# Patient Record
Sex: Male | Born: 1965 | Race: White | Hispanic: No | Marital: Married | Smoking: Never smoker
Health system: Southern US, Community
[De-identification: ages and names within clinical notes are randomized; demographics above are authoritative.]

## PROBLEM LIST (undated history)

## (undated) DIAGNOSIS — E7800 Pure hypercholesterolemia, unspecified: Secondary | ICD-10-CM

## (undated) HISTORY — PX: NO PAST SURGERIES: SHX2092

---

## 2003-01-05 ENCOUNTER — Encounter: Payer: Self-pay | Admitting: Emergency Medicine

## 2003-01-05 ENCOUNTER — Emergency Department (HOSPITAL_COMMUNITY): Admission: EM | Admit: 2003-01-05 | Discharge: 2003-01-05 | Payer: Self-pay | Admitting: Emergency Medicine

## 2003-01-07 ENCOUNTER — Ambulatory Visit (HOSPITAL_BASED_OUTPATIENT_CLINIC_OR_DEPARTMENT_OTHER): Admission: RE | Admit: 2003-01-07 | Discharge: 2003-01-07 | Payer: Self-pay | Admitting: *Deleted

## 2006-12-28 ENCOUNTER — Ambulatory Visit: Payer: Self-pay | Admitting: Internal Medicine

## 2015-09-03 ENCOUNTER — Encounter (INDEPENDENT_AMBULATORY_CARE_PROVIDER_SITE_OTHER): Payer: Self-pay | Admitting: *Deleted

## 2015-10-14 ENCOUNTER — Encounter (INDEPENDENT_AMBULATORY_CARE_PROVIDER_SITE_OTHER): Payer: Self-pay | Admitting: *Deleted

## 2015-10-14 ENCOUNTER — Other Ambulatory Visit (INDEPENDENT_AMBULATORY_CARE_PROVIDER_SITE_OTHER): Payer: Self-pay | Admitting: *Deleted

## 2015-10-14 DIAGNOSIS — Z1211 Encounter for screening for malignant neoplasm of colon: Secondary | ICD-10-CM

## 2015-10-28 ENCOUNTER — Ambulatory Visit: Payer: Self-pay

## 2015-10-28 ENCOUNTER — Other Ambulatory Visit: Payer: Self-pay | Admitting: Occupational Medicine

## 2015-10-28 DIAGNOSIS — Z0000 Encounter for general adult medical examination without abnormal findings: Secondary | ICD-10-CM

## 2015-10-30 ENCOUNTER — Encounter (INDEPENDENT_AMBULATORY_CARE_PROVIDER_SITE_OTHER): Payer: Self-pay | Admitting: *Deleted

## 2015-10-30 ENCOUNTER — Other Ambulatory Visit (INDEPENDENT_AMBULATORY_CARE_PROVIDER_SITE_OTHER): Payer: Self-pay | Admitting: *Deleted

## 2015-10-30 NOTE — Telephone Encounter (Signed)
Patient needs suprep 

## 2015-10-31 MED ORDER — SUPREP BOWEL PREP KIT 17.5-3.13-1.6 GM/177ML PO SOLN: 1.0000 | Freq: Once | ORAL | Status: DC

## 2015-11-18 ENCOUNTER — Telehealth (INDEPENDENT_AMBULATORY_CARE_PROVIDER_SITE_OTHER): Payer: Self-pay | Admitting: *Deleted

## 2015-11-18 NOTE — Telephone Encounter (Signed)
Referring MD/PCP: fusco   Procedure: tcs  Reason/Indication:  screening  Has patient had this procedure before?  no  If so, when, by whom and where?    Is there a family history of colon cancer?  no  Who?  What age when diagnosed?    Is patient diabetic?   no      Does patient have prosthetic heart valve or mechanical valve?  no  Do you have a pacemaker?  no  Has patient ever had endocarditis? no  Has patient had joint replacement within last 12 months?  no  Does patient tend to be constipated or take laxatives? no  Does patient have a history of alcohol/drug use?  no  Is patient on Coumadin, Plavix and/or Aspirin? yes  Medications: asa 81 mg daily, mega red daily, osteo biflex daily, atorvastatin 10 mg daily  Allergies: nkda  Medication Adjustment: asa 2 days  Procedure date & time: 12/11/15 at 1200

## 2015-11-19 NOTE — Telephone Encounter (Signed)
agree

## 2015-12-11 ENCOUNTER — Ambulatory Visit (HOSPITAL_COMMUNITY)
Admission: RE | Admit: 2015-12-11 | Discharge: 2015-12-11 | Disposition: A | Discharge status: Home / Self Care | Payer: 59 | Source: Ambulatory Visit | Attending: Internal Medicine | Admitting: Internal Medicine

## 2015-12-11 ENCOUNTER — Encounter (HOSPITAL_COMMUNITY)
Admission: RE | Disposition: A | Discharge status: Home / Self Care | Payer: Self-pay | Source: Ambulatory Visit | Attending: Internal Medicine

## 2015-12-11 ENCOUNTER — Encounter (HOSPITAL_COMMUNITY): Payer: Self-pay | Admitting: *Deleted

## 2015-12-11 DIAGNOSIS — K644 Residual hemorrhoidal skin tags: Secondary | ICD-10-CM

## 2015-12-11 DIAGNOSIS — D123 Benign neoplasm of transverse colon: Secondary | ICD-10-CM | POA: Insufficient documentation

## 2015-12-11 DIAGNOSIS — E7800 Pure hypercholesterolemia, unspecified: Secondary | ICD-10-CM | POA: Insufficient documentation

## 2015-12-11 DIAGNOSIS — Z1211 Encounter for screening for malignant neoplasm of colon: Secondary | ICD-10-CM | POA: Diagnosis present

## 2015-12-11 DIAGNOSIS — Z7982 Long term (current) use of aspirin: Secondary | ICD-10-CM | POA: Insufficient documentation

## 2015-12-11 DIAGNOSIS — Z79899 Other long term (current) drug therapy: Secondary | ICD-10-CM | POA: Diagnosis not present

## 2015-12-11 HISTORY — DX: Pure hypercholesterolemia, unspecified: E78.00

## 2015-12-11 HISTORY — PX: POLYPECTOMY: SHX5525

## 2015-12-11 HISTORY — PX: COLONOSCOPY: SHX5424

## 2015-12-11 SURGERY — COLONOSCOPY
Anesthesia: Moderate Sedation

## 2015-12-11 MED ORDER — SODIUM CHLORIDE 0.9 % IV SOLN
INTRAVENOUS | Status: DC
  Administered 2015-12-11: 11:00:00 via INTRAVENOUS

## 2015-12-11 MED ORDER — MIDAZOLAM HCL 5 MG/5ML IJ SOLN
INTRAMUSCULAR | Status: DC | PRN
  Administered 2015-12-11 (×3): 2 mg via INTRAVENOUS

## 2015-12-11 MED ORDER — MEPERIDINE HCL 50 MG/ML IJ SOLN
INTRAMUSCULAR | Status: DC | PRN
  Administered 2015-12-11 (×2): 25 mg via INTRAVENOUS

## 2015-12-11 MED ORDER — MIDAZOLAM HCL 5 MG/5ML IJ SOLN
INTRAMUSCULAR | Status: AC
  Filled 2015-12-11: qty 10

## 2015-12-11 MED ORDER — MEPERIDINE HCL 50 MG/ML IJ SOLN
INTRAMUSCULAR | Status: AC
  Filled 2015-12-11: qty 1

## 2015-12-11 NOTE — H&P (Signed)
Nathan Simmons is an 50 y.o. male.   Chief Complaint: Patient is here for colonoscopy. HPI: 50 year old Caucasian male who is in for screening colonoscopy. He denies abdominal pain change in bowel habits or rectal bleeding. Family history is negative for CRC.  Past Medical History  Diagnosis Date  . Hypercholesteremia     Past Surgical History  Procedure Laterality Date  . No past surgeries      History reviewed. No pertinent family history. Social History:  reports that he has never smoked. He does not have any smokeless tobacco history on file. He reports that he drinks alcohol. He reports that he does not use illicit drugs.  Allergies: No Known Allergies  Medications Prior to Admission  Medication Sig Dispense Refill  . aspirin EC 81 MG tablet Take 81 mg by mouth daily.    Marland Kitchen atorvastatin (LIPITOR) 10 MG tablet Take 1 tablet by mouth daily.  3  . Bioflavonoid Products (BIOFLEX PO) Take 1 tablet by mouth daily.    Marland Kitchen KRILL OIL PO Take 1 capsule by mouth daily.    . Multiple Vitamin (MULTIVITAMIN WITH MINERALS) TABS tablet Take 1 tablet by mouth daily.    Manus Gunning BOWEL PREP SOLN Take 1 kit by mouth once. 1 Bottle 0    No results found for this or any previous visit (from the past 48 hour(s)). No results found.  ROS  Blood pressure 131/83, temperature 97.8 F (36.6 C), temperature source Oral, resp. rate 18, height 5' 8"  (1.727 m), weight 183 lb (83.008 kg), SpO2 99 %. Physical Exam  Constitutional: He appears well-developed and well-nourished.  HENT:  Mouth/Throat: Oropharynx is clear and moist.  Eyes: Conjunctivae are normal. No scleral icterus.  Neck: No thyromegaly present.  Cardiovascular: Normal rate, regular rhythm and normal heart sounds.   No murmur heard. Respiratory: Effort normal and breath sounds normal.  GI: Soft. He exhibits no distension and no mass. There is no tenderness.  Musculoskeletal: He exhibits no edema.  Lymphadenopathy:    He has no cervical  adenopathy.  Neurological: He is alert.  Skin: Skin is warm and dry.     Assessment/Plan Average risk screening colonoscopy.  Rogene Houston, MD 12/11/2015, 11:34 AM

## 2015-12-11 NOTE — Op Note (Signed)
[facility] Patient Name: Nathan Simmons Procedure Date: 12/11/2015 11:11 AM MRN: [id number] Date of Birth: 13-Jun-1966 Attending MD: Hildred Laser , MD CSN: [id number] Age: 50 Admit Type: Outpatient Procedure:                Colonoscopy Indications:              Screening for colorectal malignant neoplasm Providers:                Hildred Laser, MD, Renda Rolls, RN, Isabella Stalling,                            Technician Referring MD:             Glo Herring, MD Medicines:                Meperidine 50 mg IV, Midazolam 6 mg IV Complications:            No immediate complications. Estimated Blood Loss:     Estimated blood loss was minimal. Procedure:                Pre-Anesthesia Assessment:                           - Prior to the procedure, a History and Physical                            was performed, and patient medications and                            allergies were reviewed. The patient's tolerance of                            previous anesthesia was also reviewed. The risks                            and benefits of the procedure and the sedation                            options and risks were discussed with the patient.                            All questions were answered, and informed consent                            was obtained. Prior Anticoagulants: The patient has                            taken no previous anticoagulant or antiplatelet                            agents. ASA Grade Assessment: I - A normal, healthy                            patient. After reviewing the risks and benefits,  the patient was deemed in satisfactory condition to                            undergo the procedure.                           After obtaining informed consent, the colonoscope                            was passed under direct vision. Throughout the                            procedure, the patient's blood pressure, pulse, and              oxygen saturations were monitored continuously. The                            EC-[id number]) scope was introduced through                            the anus and advanced to the the cecum, identified                            by appendiceal orifice and ileocecal valve. The                            colonoscopy was performed without difficulty. The                            patient tolerated the procedure well. The quality                            of the bowel preparation was adequate. The                            ileocecal valve, appendiceal orifice, and rectum                            were photographed. Scope In: 11:43:44 AM Scope Out: 12:00:01 PM Scope Withdrawal Time: 0 hours 10 minutes 34 seconds  Total Procedure Duration: 0 hours 16 minutes 17 seconds  Findings:      A 5 mm polyp was found in the hepatic flexure. The polyp was sessile.       The polyp was removed with a cold snare. Resection and retrieval were       complete.      External hemorrhoids were found during retroflexion. The hemorrhoids       were small. Impression:               - One 5 mm polyp at the hepatic flexure, removed                            with a cold snare. Resected and retrieved.                           -  External hemorrhoids. Moderate Sedation:      Moderate (conscious) sedation was administered by the endoscopy nurse       and supervised by the endoscopist. The following parameters were       monitored: oxygen saturation, heart rate, blood pressure, CO2       capnography and response to care. Total physician intraservice time was       24 minutes. Recommendation:           - Patient has a contact number available for                            emergencies. The signs and symptoms of potential                            delayed complications were discussed with the                            patient. Return to normal activities tomorrow.                            Written  discharge instructions were provided to the                            patient.                           - Resume previous diet today.                           - Continue present medications.                           - Repeat colonoscopy date to be determined after                            pending pathology results are reviewed for                            surveillance based on pathology results. Procedure Code(s):        --- Professional ---                           [phone], Colonoscopy, flexible; with removal of                            tumor(s), polyp(s), or other lesion(s) by snare                            technique                           99152, Moderate sedation services provided by the                            same physician or other qualified health care  professional performing the diagnostic or                            therapeutic service that the sedation supports,                            requiring the presence of an independent trained                            observer to assist in the monitoring of the                            patient's level of consciousness and physiological                            status; initial 15 minutes of intraservice time,                            patient age 5 years or older                           [phone], Moderate sedation services; each additional                            15 minutes intraservice time Diagnosis Code(s):        --- Professional ---                           Z12.11, Encounter for screening for malignant                            neoplasm of colon                           D12.3, Benign neoplasm of transverse colon (hepatic                            flexure or splenic flexure)                           K64.4, Residual hemorrhoidal skin tags CPT copyright 2016 American Medical Association. All rights reserved. The codes documented in this report are preliminary and upon coder  review may  be revised to meet current compliance requirements. Hildred Laser, MD Hildred Laser, MD 12/11/2015 12:08:38 PM This report has been signed electronically. Number of Addenda: 0

## 2015-12-11 NOTE — Discharge Instructions (Signed)
Resume usual medications and diet. No driving for 24 hours. Physician will call with biopsy results.  Colon Polyps Polyps are lumps of extra tissue growing inside the body. Polyps can grow in the large intestine (colon). Most colon polyps are noncancerous (benign). However, some colon polyps can become cancerous over time. Polyps that are larger than a pea may be harmful. To be safe, caregivers remove and test all polyps. CAUSES  Polyps form when mutations in the genes cause your cells to grow and divide even though no more tissue is needed. RISK FACTORS There are a number of risk factors that can increase your chances of getting colon polyps. They include:  Being older than 50 years.  Family history of colon polyps or colon cancer.  Long-term colon diseases, such as colitis or Crohn disease.  Being overweight.  Smoking.  Being inactive.  Drinking too much alcohol. SYMPTOMS  Most small polyps do not cause symptoms. If symptoms are present, they may include:  Blood in the stool. The stool may look dark red or black.  Constipation or diarrhea that lasts longer than 1 week. DIAGNOSIS People often do not know they have polyps until their caregiver finds them during a regular checkup. Your caregiver can use 4 tests to check for polyps:  Digital rectal exam. The caregiver wears gloves and feels inside the rectum. This test would find polyps only in the rectum.  Barium enema. The caregiver puts a liquid called barium into your rectum before taking X-rays of your colon. Barium makes your colon look white. Polyps are dark, so they are easy to see in the X-ray pictures.  Sigmoidoscopy. A thin, flexible tube (sigmoidoscope) is placed into your rectum. The sigmoidoscope has a light and tiny camera in it. The caregiver uses the sigmoidoscope to look at the last third of your colon.  Colonoscopy. This test is like sigmoidoscopy, but the caregiver looks at the entire colon. This is the most  common method for finding and removing polyps. TREATMENT  Any polyps will be removed during a sigmoidoscopy or colonoscopy. The polyps are then tested for cancer. PREVENTION  To help lower your risk of getting more colon polyps:  Eat plenty of fruits and vegetables. Avoid eating fatty foods.  Do not smoke.  Avoid drinking alcohol.  Exercise every day.  Lose weight if recommended by your caregiver.  Eat plenty of calcium and folate. Foods that are rich in calcium include milk, cheese, and broccoli. Foods that are rich in folate include chickpeas, kidney beans, and spinach. HOME CARE INSTRUCTIONS Keep all follow-up appointments as directed by your caregiver. You may need periodic exams to check for polyps. SEEK MEDICAL CARE IF: You notice bleeding during a bowel movement.   This information is not intended to replace advice given to you by your health care provider. Make sure you discuss any questions you have with your health care provider.   Document Released: 06/02/2004 Document Revised: 09/27/2014 Document Reviewed: 11/16/2011 Elsevier Interactive Patient Education 2016 Elsevier [employer]. Colonoscopy, Care After Refer to this sheet in the next few weeks. These instructions provide you with information on caring for yourself after your procedure. Your health care provider may also give you more specific instructions. Your treatment has been planned according to current medical practices, but problems sometimes occur. Call your health care provider if you have any problems or questions after your procedure. WHAT TO EXPECT AFTER THE PROCEDURE  After your procedure, it is typical to have the following:  A small  amount of blood in your stool.  Moderate amounts of gas and mild abdominal cramping or bloating. HOME CARE INSTRUCTIONS  Do not drive, operate machinery, or sign important documents for 24 hours.  You may shower and resume your regular physical activities, but move at a slower  pace for the first 24 hours.  Take frequent rest periods for the first 24 hours.  Walk around or put a warm pack on your abdomen to help reduce abdominal cramping and bloating.  Drink enough fluids to keep your urine clear or pale yellow.  You may resume your normal diet as instructed by your health care provider. Avoid heavy or fried foods that are hard to digest.  Avoid drinking alcohol for 24 hours or as instructed by your health care provider.  Only take over-the-counter or prescription medicines as directed by your health care provider.  If a tissue sample (biopsy) was taken during your procedure:  Do not take aspirin or blood thinners for 7 days, or as instructed by your health care provider.  Do not drink alcohol for 7 days, or as instructed by your health care provider.  Eat soft foods for the first 24 hours. SEEK MEDICAL CARE IF: You have persistent spotting of blood in your stool 2-3 days after the procedure. SEEK IMMEDIATE MEDICAL CARE IF:  You have more than a small spotting of blood in your stool.  You pass large blood clots in your stool.  Your abdomen is swollen (distended).  You have nausea or vomiting.  You have a fever.  You have increasing abdominal pain that is not relieved with medicine.   This information is not intended to replace advice given to you by your health care provider. Make sure you discuss any questions you have with your health care provider.   Document Released: 04/20/2004 Document Revised: 06/27/2013 Document Reviewed: 05/14/2013 Elsevier Interactive Patient Education [employer].

## 2015-12-15 ENCOUNTER — Encounter (HOSPITAL_COMMUNITY): Payer: Self-pay | Admitting: Internal Medicine

## 2016-12-24 IMAGING — CR DG CHEST 1V
1 series · 1 of 1 positions shown · non-contrast
Comparison: None.

CLINICAL DATA: Annual exam.  Nonsmoker.

EXAM:
CHEST 1 VIEW

[view not recorded]
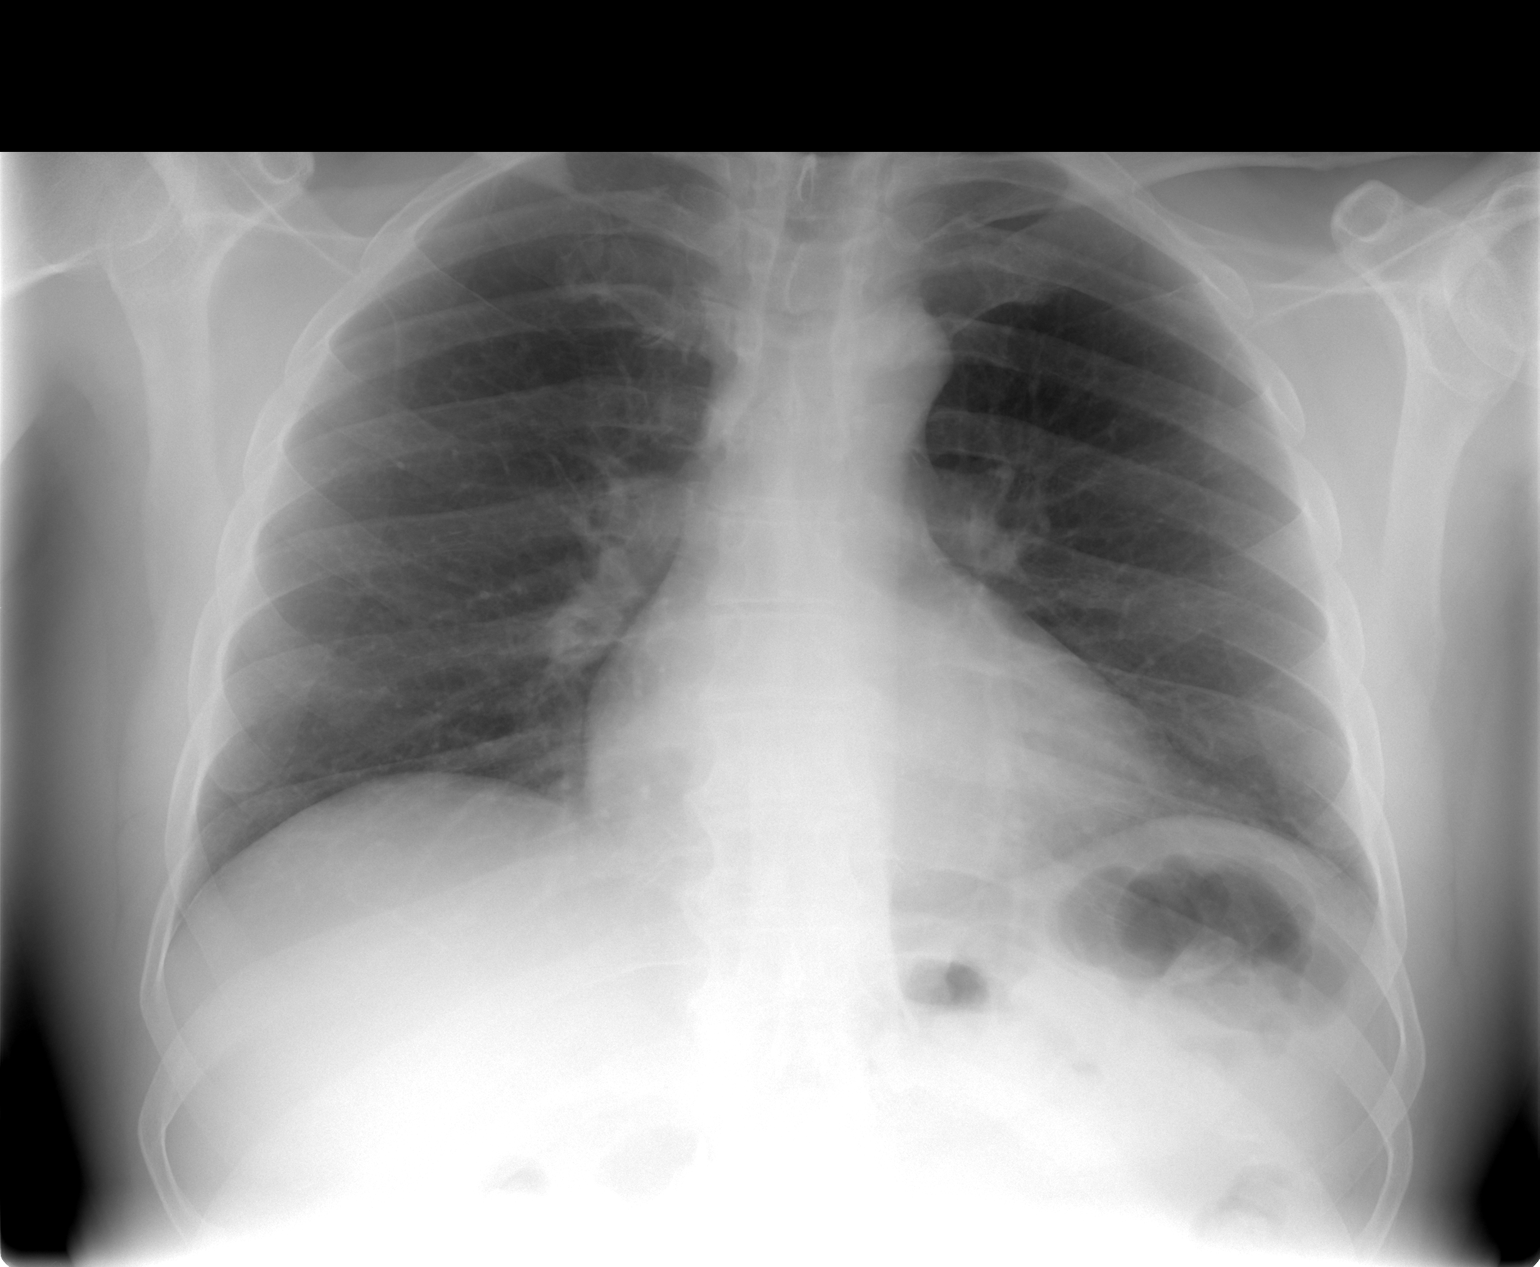

[1 of 1 positions shown; findings below may reference images not displayed]

FINDINGS: The heart size and mediastinal contours are within normal limits.
Both lungs are clear. The visualized skeletal structures are
unremarkable.
IMPRESSION: No active disease.

## 2017-04-18 DIAGNOSIS — H401121 Primary open-angle glaucoma, left eye, mild stage: Secondary | ICD-10-CM | POA: Diagnosis not present

## 2017-04-29 DIAGNOSIS — E748 Other specified disorders of carbohydrate metabolism: Secondary | ICD-10-CM | POA: Diagnosis not present

## 2017-04-29 DIAGNOSIS — Z1389 Encounter for screening for other disorder: Secondary | ICD-10-CM | POA: Diagnosis not present

## 2017-04-29 DIAGNOSIS — Z0000 Encounter for general adult medical examination without abnormal findings: Secondary | ICD-10-CM | POA: Diagnosis not present

## 2017-10-03 DIAGNOSIS — H40051 Ocular hypertension, right eye: Secondary | ICD-10-CM | POA: Diagnosis not present

## 2017-10-03 DIAGNOSIS — H401121 Primary open-angle glaucoma, left eye, mild stage: Secondary | ICD-10-CM | POA: Diagnosis not present

## 2017-10-13 DIAGNOSIS — Z6830 Body mass index (BMI) 30.0-30.9, adult: Secondary | ICD-10-CM | POA: Diagnosis not present

## 2017-10-13 DIAGNOSIS — J329 Chronic sinusitis, unspecified: Secondary | ICD-10-CM | POA: Diagnosis not present

## 2017-10-13 DIAGNOSIS — E669 Obesity, unspecified: Secondary | ICD-10-CM | POA: Diagnosis not present

## 2018-09-11 DIAGNOSIS — Z0000 Encounter for general adult medical examination without abnormal findings: Secondary | ICD-10-CM | POA: Diagnosis not present

## 2018-09-11 DIAGNOSIS — E663 Overweight: Secondary | ICD-10-CM | POA: Diagnosis not present

## 2018-09-11 DIAGNOSIS — E782 Mixed hyperlipidemia: Secondary | ICD-10-CM | POA: Diagnosis not present

## 2018-09-11 DIAGNOSIS — Z6829 Body mass index (BMI) 29.0-29.9, adult: Secondary | ICD-10-CM | POA: Diagnosis not present

## 2018-09-11 DIAGNOSIS — R001 Bradycardia, unspecified: Secondary | ICD-10-CM | POA: Diagnosis not present

## 2018-09-11 DIAGNOSIS — Z1389 Encounter for screening for other disorder: Secondary | ICD-10-CM | POA: Diagnosis not present

## 2021-01-01 ENCOUNTER — Emergency Department (HOSPITAL_COMMUNITY)
Admission: EM | Admit: 2021-01-01 | Discharge: 2021-01-01 | Disposition: A | Discharge status: Home / Self Care | Payer: 59 | Attending: Emergency Medicine | Admitting: Emergency Medicine

## 2021-01-01 ENCOUNTER — Other Ambulatory Visit: Payer: Self-pay

## 2021-01-01 ENCOUNTER — Encounter (HOSPITAL_COMMUNITY): Payer: Self-pay | Admitting: Emergency Medicine

## 2021-01-01 ENCOUNTER — Emergency Department (HOSPITAL_COMMUNITY): Payer: 59

## 2021-01-01 DIAGNOSIS — Z7982 Long term (current) use of aspirin: Secondary | ICD-10-CM | POA: Insufficient documentation

## 2021-01-01 DIAGNOSIS — L02519 Cutaneous abscess of unspecified hand: Secondary | ICD-10-CM

## 2021-01-01 DIAGNOSIS — L02512 Cutaneous abscess of left hand: Secondary | ICD-10-CM | POA: Insufficient documentation

## 2021-01-01 LAB — COMPREHENSIVE METABOLIC PANEL
ALT: 26 U/L (ref 0–44)
AST: 27 U/L (ref 15–41)
Albumin: 3.9 g/dL (ref 3.5–5.0)
Alkaline Phosphatase: 66 U/L (ref 38–126)
Anion gap: 10 (ref 5–15)
BUN: 14 mg/dL (ref 6–20)
CO2: 22 mmol/L (ref 22–32)
Calcium: 9.1 mg/dL (ref 8.9–10.3)
Chloride: 104 mmol/L (ref 98–111)
Creatinine, Ser: 1.1 mg/dL (ref 0.61–1.24)
GFR, Estimated: >60 mL/min (ref >60)
Glucose, Bld: 107 mg/dL — ABNORMAL HIGH (ref 70–99)
Potassium: 3.7 mmol/L (ref 3.5–5.1)
Sodium: 136 mmol/L (ref 135–145)
Total Bilirubin: 0.5 mg/dL (ref 0.3–1.2)
Total Protein: 7.1 g/dL (ref 6.5–8.1)

## 2021-01-01 LAB — CBC WITH DIFFERENTIAL/PLATELET
Abs Immature Granulocytes: 0.03 10*3/uL (ref 0.00–0.07)
Basophils Absolute: 0.1 10*3/uL (ref 0.0–0.1)
Basophils Relative: 1 %
Eosinophils Absolute: 0.1 10*3/uL (ref 0.0–0.5)
Eosinophils Relative: 1 %
HCT: 41.1 % (ref 39.0–52.0)
Hemoglobin: 13.6 g/dL (ref 13.0–17.0)
Immature Granulocytes: 0 %
Lymphocytes Relative: 18 %
Lymphs Abs: 2 10*3/uL (ref 0.7–4.0)
MCH: 28.6 pg (ref 26.0–34.0)
MCHC: 33.1 g/dL (ref 30.0–36.0)
MCV: 86.3 fL (ref 80.0–100.0)
Monocytes Absolute: 1.2 10*3/uL — ABNORMAL HIGH (ref 0.1–1.0)
Monocytes Relative: 11 %
Neutro Abs: 7.9 10*3/uL — ABNORMAL HIGH (ref 1.7–7.7)
Neutrophils Relative %: 69 %
Platelets: 310 10*3/uL (ref 150–400)
RBC: 4.76 MIL/uL (ref 4.22–5.81)
RDW: 14.7 % (ref 11.5–15.5)
WBC: 11.3 10*3/uL — ABNORMAL HIGH (ref 4.0–10.5)
nRBC: 0 % (ref 0.0–0.2)

## 2021-01-01 MED ORDER — DOXYCYCLINE HYCLATE 100 MG PO CAPS: 100.0000 mg | ORAL_CAPSULE | Freq: Two times a day (BID) | ORAL | 0 refills | Status: DC

## 2021-01-01 MED ORDER — LIDOCAINE HCL (PF) 1 % IJ SOLN
5.0000 mL | Freq: Once | INTRAMUSCULAR | Status: AC
  Administered 2021-01-01: 5 mL
  Filled 2021-01-01: qty 5

## 2021-01-01 NOTE — Discharge Instructions (Addendum)
Continue the antibiotic for a full 10-day course. Follow up with Dr. Lenon Curt to insure the infection is improving and not worsening.   Return to the ED with any worsening symptoms or new concerns.

## 2021-01-01 NOTE — ED Provider Notes (Addendum)
MSE was initiated and I personally evaluated the patient and placed orders (if any) at  4:55 AM on January 01, 2021.  Doing yard work 6 days ago and felt like he got a blister on his left hand at the base of the middle finger. It continued to swelling, extending to the dorsal hand. On Doxycycline x 2 days without improvement. No fever.   Today's Vitals   01/01/21 0455 01/01/21 0457  BP:  (!) 142/87  Pulse:  70  Resp:  16  Temp:  98.5 F (36.9 C)  TempSrc:  Oral  SpO2:  99%  Weight: 91 kg   Height: 5\' 8"  (1.727 m)   PainSc: 0-No pain    Body mass index is 30.5 kg/m.  Draining blister to palmar left hand at base of ring finger. FROM all joints. Swelling over dorsal hand with erythema.   The patient appears stable so that the remainder of the MSE may be completed by another provider.   Charlann Lange, PA-C 01/01/21 0500    Charlann Lange, PA-C 01/01/21 4081    Ezequiel Essex, MD 01/01/21 [phone]

## 2021-01-01 NOTE — ED Notes (Signed)
Provider at bedside at this time

## 2021-01-01 NOTE — ED Notes (Signed)
Provider at bedside

## 2021-01-01 NOTE — ED Triage Notes (Signed)
Patient reports left hand skin abscess with drainage onset last Friday , prescribed by his PCP with Doxycycline with no improvement .

## 2021-01-01 NOTE — ED Notes (Signed)
Radiology at bedside at this time.

## 2021-01-01 NOTE — ED Provider Notes (Signed)
[facility] EMERGENCY DEPARTMENT Provider Note   CSN: [id number] Arrival date & time: 01/01/21  0450     History Chief Complaint  Patient presents with  . Abscess    Nathan Simmons is a 55 y.o. male.  Doing yard work 6 days ago and felt like he got a blister on his left hand at the base of the ring finger. He thought initially he might been stuck by something or had a foreign body. It continued to swell, extending to the dorsal hand. He went to his PCP and has been on Doxycycline x 2 days without improvement. He does not feel it is worse. No fever. He reports that he used a needle and has been able to drain purulent material from the wound.   The history is provided by the patient. No language interpreter was used.  Abscess Associated symptoms: no fever and no nausea        Past Medical History:  Diagnosis Date  . Hypercholesteremia     There are no problems to display for this patient.   Past Surgical History:  Procedure Laterality Date  . COLONOSCOPY N/A 12/11/2015   Procedure: COLONOSCOPY;  Surgeon: Rogene Houston, MD;  Location: [facility] SU[facility];  Service: Endoscopy;  Laterality: N/A;  1130 - moved to 3/23 @ 12:00  . NO PAST SURGERIES    . POLYPECTOMY  12/11/2015   Procedure: POLYPECTOMY;  Surgeon: Rogene Houston, MD;  Location: [facility] ENDO SUITE;  Service: Endoscopy;;  Hepatic flexure polyp removed via cold snare       No family history on file.  Social History   Tobacco Use  . Smoking status: Never Smoker  . Smokeless tobacco: Never Used  Substance Use Topics  . Alcohol use: Yes    Comment: Once montyly  . Drug use: No    Home Medications Prior to Admission medications   Medication Sig Start Date End Date Taking? Authorizing Provider  aspirin EC 81 MG tablet Take 81 mg by mouth daily.    [provider]  atorvastatin (LIPITOR) 10 MG tablet Take 1 tablet by mouth daily. 08/29/15   [provider]  Bioflavonoid Products  (BIOFLEX PO) Take 1 tablet by mouth daily.    [provider]  KRILL OIL PO Take 1 capsule by mouth daily.    [provider]  Multiple Vitamin (MULTIVITAMIN WITH MINERALS) TABS tablet Take 1 tablet by mouth daily.    [provider]    Allergies    Patient has no known allergies.  Review of Systems   Review of Systems  Constitutional: Negative for fever.  Gastrointestinal: Negative for nausea.  Musculoskeletal:       See HPI.  Skin: Positive for color change and wound.  Neurological: Negative for numbness.    Physical Exam Updated Vital Signs BP (!) 142/87 (BP Location: Right Arm)   Pulse 70   Temp 98.5 F (36.9 C) (Oral)   Resp 16   Ht 5\' 8"  (1.727 m)   Wt 91 kg   SpO2 99%   BMI 30.50 kg/m   Physical Exam Constitutional:      Appearance: He is well-developed.  Pulmonary:     Effort: Pulmonary effort is normal.  Musculoskeletal:        General: Normal range of motion.     Cervical back: Normal range of motion.     Comments: Wound to left palmar hand over 4th MCP. No pain with movement of  the joint. No flexor tendon tenderness. 4th finger is swollen but soft and not significantly tender.  Skin:    General: Skin is warm and dry.     Comments: Blister to palmar base 4th left digit with swelling over ulnar dorsum of hand to wrist with erythema.   Neurological:     Mental Status: He is alert and oriented to person, place, and time.       ED Results / Procedures / Treatments   Labs (all labs ordered are listed, but only abnormal results are displayed) Labs Reviewed  CBC WITH DIFFERENTIAL/PLATELET  COMPREHENSIVE METABOLIC PANEL    EKG None  Radiology No results found.  Procedures .Marland KitchenIncision and Drainage  Date/Time: 01/01/2021 6:17 AM Performed by: Charlann Lange, PA-C Authorized by: Charlann Lange, PA-C   Consent:    Consent obtained:  Verbal   Consent given by:  Patient Universal protocol:    Procedure explained and  questions answered to patient or proxy's satisfaction: yes     Immediately prior to procedure, a time out was called: yes     Patient identity confirmed:  Verbally with patient Location:    Type:  Abscess   Location:  Upper extremity   Upper extremity location:  Hand   Hand location:  L hand Pre-procedure details:    Skin preparation:  Povidone-iodine Sedation:    Sedation type:  None Anesthesia:    Anesthesia method:  Local infiltration   Local anesthetic:  Lidocaine 1% w/o epi Procedure type:    Complexity:  Simple Procedure details:    Needle aspiration: no     Incision types:  Stab incision   Wound management:  Probed and deloculated and irrigated with saline   Drainage:  Bloody and purulent   Drainage amount:  Scant   Wound treatment:  Wound left open   Packing materials:  None Post-procedure details:    Procedure completion:  Tolerated well, no immediate complications     Medications Ordered in ED Medications - No data to display  ED Course  I have reviewed the triage vital signs and the nursing notes.  Pertinent labs & imaging results that were available during my care of the patient were reviewed by me and considered in my medical decision making (see chart for details).    MDM Rules/Calculators/A&P                          Patient to ED with wound/abscess to left palmar hand as detailed in the HPI.   Labs reviewed. No leukocytosis. No fever. He has had a total of 4 doses of the Doxycycline. No foreign body on imaging. Area opened to facilitate drainage and provide cleaning and irrigation per procedure note.   He can be discharged home. Encouraged to continue Doxycycline, which likely has not had sufficient time to provide significant change but is felt an appropriate choice of abx. Will refer to hand ortho to insure infection is improving and not worsening requiring further intervention.    Final Clinical Impression(s) / ED Diagnoses Final diagnoses:  None    1. Hand abscess   Rx / DC Orders ED Discharge Orders    None       Charlann Lange, PA-C 01/01/21 8413    Orpah Greek, MD 01/01/21 [phone]

## 2022-10-22 ENCOUNTER — Telehealth: Payer: Self-pay | Admitting: *Deleted

## 2022-10-22 NOTE — Telephone Encounter (Signed)
Pt wife called in. Was told pt needed TCS in 11/2022 and has not heard anything. He said he would be put on a recall list.

## 2022-10-22 NOTE — Telephone Encounter (Signed)
He is on recall for March 2024, I will work on recall for our office later this month

## 2022-10-22 NOTE — Telephone Encounter (Signed)
Called back, LMOVM

## 2022-11-10 ENCOUNTER — Encounter (INDEPENDENT_AMBULATORY_CARE_PROVIDER_SITE_OTHER): Payer: Self-pay | Admitting: *Deleted

## 2022-11-16 ENCOUNTER — Telehealth (INDEPENDENT_AMBULATORY_CARE_PROVIDER_SITE_OTHER): Payer: Self-pay | Admitting: Gastroenterology

## 2022-11-16 NOTE — Telephone Encounter (Signed)
Any room  Thanks

## 2022-11-16 NOTE — Telephone Encounter (Signed)
Who is your primary care physician: Dr.[facility]  Reasons for the colonoscopy: Recall  Have you had a colonoscopy before?  yes  Do you have family history of colon cancer? no  Previous colonoscopy with polyps removed? Yes; 7 yrs ago?  Do you have a history colorectal cancer?   no  Are you diabetic? If yes, Type 1 or Type 2?    No  Do you have a prosthetic or mechanical heart valve? No  Do you have a pacemaker/defibrillator?   no  Have you had endocarditis/atrial fibrillation? no  Have you had joint replacement within the last 12 months?  no  Do you tend to be constipated or have to use laxatives? no  Do you have any history of drugs or alchohol?  no  Do you use supplemental oxygen?  no  Have you had a stroke or heart attack within the last 6 months? no  Do you take weight loss medication? No  Do you take any blood-thinning medications such as: (aspirin, warfarin, Plavix, Aggrenox)  Aspirin  If yes we need the name, milligram, dosage and who is prescribing doctor  Current Outpatient Medications on File Prior to Visit  Medication Sig Dispense Refill   aspirin EC 81 MG tablet Take 81 mg by mouth daily.     atorvastatin (LIPITOR) 10 MG tablet Take 1 tablet by mouth daily.  3   Bioflavonoid Products (BIOFLEX PO) Take 1 tablet by mouth daily.     cyanocobalamin (VITAMIN B12) 1000 MCG tablet Take 1,000 mcg by mouth daily.     KRILL OIL PO Take 1 capsule by mouth daily.     Multiple Vitamin (MULTIVITAMIN WITH MINERALS) TABS tablet Take 1 tablet by mouth daily.     No current facility-administered medications on file prior to visit.    No Known Allergies   Pharmacy: [facility]  Primary Insurance Name: [employer] [place] number where you can be reached: [phone] or [phone]

## 2022-11-17 NOTE — Telephone Encounter (Signed)
Left message to return call on home phone. Contacted cell number and spoke with patient- pt will need to get calendar and will then return call

## 2022-11-18 MED ORDER — PEG 3350-KCL-NA BICARB-NACL 420 G PO SOLR: 4000.0000 mL | Freq: Once | ORAL | 0 refills | Status: AC

## 2022-11-18 NOTE — Telephone Encounter (Signed)
Pt returned call and has been scheduled for colonoscopy on 12/08/22. Instructions sent via my chart and mail. Prep sent to pharmacy.    Notification or Prior Authorization is not required for the requested services This [employer] plan does not currently require a prior authorization for these services. If you have general questions about the prior authorization requirements, please call us at [phone] or visit [email or web address] > Clinician Resources > Advance and Admission Notification Requirements. The number above acknowledges your notification. Please write this number down for future reference. Notification is not a guarantee of coverage or payment. Decision ID #: [id number]

## 2022-11-18 NOTE — Addendum Note (Signed)
Addended by: Vicente Males on: 11/18/2022 10:11 AM   Modules accepted: Orders

## 2022-11-18 NOTE — Telephone Encounter (Signed)
Pt left voicemail. Attempted to return call but had to leave message

## 2022-11-23 NOTE — Telephone Encounter (Signed)
Questionnaire from recall, no referral needed  

## 2022-12-08 ENCOUNTER — Encounter (HOSPITAL_COMMUNITY): Payer: Self-pay | Admitting: Gastroenterology

## 2022-12-08 ENCOUNTER — Encounter (HOSPITAL_COMMUNITY)
Admission: RE | Disposition: A | Discharge status: Home / Self Care | Payer: Self-pay | Source: Home / Self Care | Attending: Gastroenterology

## 2022-12-08 ENCOUNTER — Encounter (INDEPENDENT_AMBULATORY_CARE_PROVIDER_SITE_OTHER): Payer: Self-pay | Admitting: *Deleted

## 2022-12-08 ENCOUNTER — Ambulatory Visit (HOSPITAL_BASED_OUTPATIENT_CLINIC_OR_DEPARTMENT_OTHER): Payer: 59 | Admitting: Anesthesiology

## 2022-12-08 ENCOUNTER — Other Ambulatory Visit: Payer: Self-pay

## 2022-12-08 ENCOUNTER — Ambulatory Visit (HOSPITAL_COMMUNITY): Payer: 59 | Admitting: Anesthesiology

## 2022-12-08 ENCOUNTER — Ambulatory Visit (HOSPITAL_COMMUNITY)
Admission: RE | Admit: 2022-12-08 | Discharge: 2022-12-08 | Disposition: A | Discharge status: Home / Self Care | Payer: 59 | Attending: Gastroenterology | Admitting: Gastroenterology

## 2022-12-08 DIAGNOSIS — Z1211 Encounter for screening for malignant neoplasm of colon: Secondary | ICD-10-CM | POA: Diagnosis not present

## 2022-12-08 DIAGNOSIS — Z860100 Personal history of colon polyps, unspecified: Secondary | ICD-10-CM

## 2022-12-08 DIAGNOSIS — K648 Other hemorrhoids: Secondary | ICD-10-CM | POA: Diagnosis not present

## 2022-12-08 DIAGNOSIS — E785 Hyperlipidemia, unspecified: Secondary | ICD-10-CM | POA: Diagnosis not present

## 2022-12-08 DIAGNOSIS — Z800 Family history of malignant neoplasm of digestive organs: Secondary | ICD-10-CM | POA: Diagnosis not present

## 2022-12-08 DIAGNOSIS — D122 Benign neoplasm of ascending colon: Secondary | ICD-10-CM | POA: Diagnosis not present

## 2022-12-08 HISTORY — PX: POLYPECTOMY: SHX5525

## 2022-12-08 HISTORY — PX: COLONOSCOPY WITH PROPOFOL: SHX5780

## 2022-12-08 LAB — HM COLONOSCOPY

## 2022-12-08 SURGERY — COLONOSCOPY WITH PROPOFOL
Anesthesia: General

## 2022-12-08 MED ORDER — LACTATED RINGERS IV SOLN: INTRAVENOUS | Status: DC

## 2022-12-08 MED ORDER — GLYCOPYRROLATE PF 0.2 MG/ML IJ SOSY
PREFILLED_SYRINGE | INTRAMUSCULAR | Status: DC | PRN
  Administered 2022-12-08: .2 mg via INTRAVENOUS

## 2022-12-08 MED ORDER — LIDOCAINE HCL (CARDIAC) PF 100 MG/5ML IV SOSY
PREFILLED_SYRINGE | INTRAVENOUS | Status: DC | PRN
  Administered 2022-12-08: 50 mg via INTRATRACHEAL

## 2022-12-08 MED ORDER — PROPOFOL 500 MG/50ML IV EMUL
INTRAVENOUS | Status: DC | PRN
  Administered 2022-12-08: 150 ug/kg/min via INTRAVENOUS

## 2022-12-08 MED ORDER — PROPOFOL 10 MG/ML IV BOLUS
INTRAVENOUS | Status: DC | PRN
  Administered 2022-12-08: 100 mg via INTRAVENOUS

## 2022-12-08 NOTE — Discharge Instructions (Signed)
You are being discharged to home.  Resume your previous diet.  We are waiting for your pathology results.  Your physician has recommended a repeat colonoscopy for surveillance based on pathology results.  

## 2022-12-08 NOTE — Anesthesia Preprocedure Evaluation (Addendum)
Anesthesia Evaluation  Patient identified by MRN, date of birth, ID band Patient awake    Reviewed: Allergy & Precautions, H&P , NPO status , Patient's Chart, lab work & pertinent test results  Airway Mallampati: I  TM Distance: >3 FB Neck ROM: Full    Dental  (+) Dental Advisory Given, Caps   Pulmonary neg pulmonary ROS   Pulmonary exam normal breath sounds clear to auscultation       Cardiovascular negative cardio ROS Normal cardiovascular exam Rhythm:Regular Rate:Normal     Neuro/Psych negative neurological ROS  negative psych ROS   GI/Hepatic negative GI ROS, Neg liver ROS,,,  Endo/Other  negative endocrine ROS    Renal/GU negative Renal ROS  negative genitourinary   Musculoskeletal negative musculoskeletal ROS (+)    Abdominal   Peds negative pediatric ROS (+)  Hematology negative hematology ROS (+)   Anesthesia Other Findings   Reproductive/Obstetrics negative OB ROS                              Anesthesia Physical Anesthesia Plan  ASA: 2  Anesthesia Plan: General   Post-op Pain Management: Minimal or no pain anticipated   Induction: Intravenous  PONV Risk Score and Plan: Propofol infusion  Airway Management Planned: Nasal Cannula and Natural Airway  Additional Equipment:   Intra-op Plan:   Post-operative Plan:   Informed Consent: I have reviewed the patients History and Physical, chart, labs and discussed the procedure including the risks, benefits and alternatives for the proposed anesthesia with the patient or authorized representative who has indicated his/her understanding and acceptance.     Dental advisory given  Plan Discussed with: CRNA and Surgeon  Anesthesia Plan Comments:        Anesthesia Quick Evaluation

## 2022-12-08 NOTE — Transfer of Care (Signed)
Immediate Anesthesia Transfer of Care Note  Patient: Nathan Simmons  Procedure(s) Performed: COLONOSCOPY WITH PROPOFOL POLYPECTOMY  Patient Location: Endoscopy Unit  Anesthesia Type:General  Level of Consciousness: awake, alert , oriented, and patient cooperative  Airway & Oxygen Therapy: Patient Spontanous Breathing  Post-op Assessment: Report given to RN, Post -op Vital signs reviewed and stable, and Patient moving all extremities  Post vital signs: Reviewed and stable  Last Vitals:  Vitals Value Taken Time  BP 93/43 12/08/22 1253  Temp 36.9 C 12/08/22 1253  Pulse 72 12/08/22 1253  Resp 21 12/08/22 1253  SpO2 95 % 12/08/22 1253    Last Pain:  Vitals:   12/08/22 1253  TempSrc: Oral  PainSc: 0-No pain      Patients Stated Pain Goal: 10 ([id number])  Complications: No notable events documented.

## 2022-12-08 NOTE — H&P (Signed)
Nathan Simmons is an 57 y.o. male.   Chief Complaint: History of colonic polyps HPI: 57 year old male with past medical history of hyperlipidemia, coming for history of colonic polyps.  Last colonoscopy performed in 2017, had a 5 mm TA removed from the hepatic flexure.  The patient denies having any complaints such as melena, hematochezia, abdominal pain or distention, change in her bowel movement consistency or frequency, no changes in weight recently.  Grandfather was diagnosed with colon cancer in his 87s.   Past Medical History:  Diagnosis Date   Hypercholesteremia     Past Surgical History:  Procedure Laterality Date   COLONOSCOPY N/A 12/11/2015   Procedure: COLONOSCOPY;  Surgeon: Rogene Houston, MD;  Location: [facility];  Service: Endoscopy;  Laterality: N/A;  1130 - moved to 3/23 @ 12:00   NO PAST SURGERIES     POLYPECTOMY  12/11/2015   Procedure: POLYPECTOMY;  Surgeon: Rogene Houston, MD;  Location: [facility];  Service: Endoscopy;;  Hepatic flexure polyp removed via cold snare    History reviewed. No pertinent family history. Social History:  reports that he has never smoked. He has never used smokeless tobacco. He reports current alcohol use. He reports that he does not use drugs.  Allergies:  Allergies  Allergen Reactions   Penicillins     Childhood Allergy     Medications Prior to Admission  Medication Sig Dispense Refill   aspirin EC 81 MG tablet Take 81 mg by mouth daily.     atorvastatin (LIPITOR) 10 MG tablet Take 10 mg by mouth daily.  3   cyanocobalamin (VITAMIN B12) 1000 MCG tablet Take 1,000 mcg by mouth daily.     dorzolamide-timolol (COSOPT) 2-0.5 % ophthalmic solution Place 1 drop into both eyes 2 (two) times daily.     FISH OIL-KRILL OIL PO Take 1 capsule by mouth daily.     Glucos-Chond-Hyal Ac-Ca Fructo (MOVE FREE JOINT HEALTH ADVANCE PO) Take 1 tablet by mouth daily.     Multiple Vitamin (MULTIVITAMIN WITH MINERALS) TABS tablet Take 1 tablet by  mouth daily. One a Day Men's 50+     sildenafil (VIAGRA) 100 MG tablet Take 100 mg by mouth daily as needed for erectile dysfunction.      No results found for this or any previous visit (from the past 48 hour(s)). No results found.  Review of Systems  All other systems reviewed and are negative.   Blood pressure (!) 122/91, pulse 61, temperature 98 F (36.7 C), temperature source Oral, resp. rate 16, height 5\' 8"  (1.727 m), weight 83 kg, SpO2 99 %. Physical Exam  GENERAL: The patient is AO x3, in no acute distress. HEENT: Head is normocephalic and atraumatic. EOMI are intact. Mouth is well hydrated and without lesions. NECK: Supple. No masses LUNGS: Clear to auscultation. No presence of rhonchi/wheezing/rales. Adequate chest expansion HEART: RRR, normal s1 and s2. ABDOMEN: Soft, nontender, no guarding, no peritoneal signs, and nondistended. BS +. No masses. EXTREMITIES: Without any cyanosis, clubbing, rash, lesions or edema. NEUROLOGIC: AOx3, no focal motor deficit. SKIN: no jaundice, no rashes  Assessment/Plan 57 year old male with past medical history of hyperlipidemia, coming for history of colonic polyps.  Will proceed with colonoscopy.  [name], MD 12/08/2022, 11:19 AM

## 2022-12-08 NOTE — Op Note (Signed)
[facility] Patient Name: Nathan Simmons Procedure Date: 12/08/2022 12:12 PM MRN: [id number] Date of Birth: Oct 25, 1965 Attending MD: Maylon Peppers , , [id number] CSN: [id number] Age: 57 Admit Type: Outpatient Procedure:                Colonoscopy Indications:              Screening for colorectal malignant neoplasm Providers:                Maylon Peppers, Lambert Mody, Donaldson                            Risa Grill, Technician Referring MD:              Medicines:                Monitored Anesthesia Care Complications:            No immediate complications. Estimated Blood Loss:     Estimated blood loss: none. Procedure:                Pre-Anesthesia Assessment:                           - Prior to the procedure, a History and Physical                            was performed, and patient medications, allergies                            and sensitivities were reviewed. The patient's                            tolerance of previous anesthesia was reviewed.                           - The risks and benefits of the procedure and the                            sedation options and risks were discussed with the                            patient. All questions were answered and informed                            consent was obtained.                           - ASA Grade Assessment: II - A patient with mild                            systemic disease.                           After obtaining informed consent, the colonoscope                            was passed under direct vision. Throughout the  procedure, the patient's blood pressure, pulse, and                            oxygen saturations were monitored continuously. The                            [id number] scope was introduced through                            the anus and advanced to the the cecum, identified                            by appendiceal orifice and ileocecal  valve. The                            colonoscopy was performed without difficulty. The                            patient tolerated the procedure well. The quality                            of the bowel preparation was excellent. Scope In: 12:25:52 PM Scope Out: 12:48:01 PM Scope Withdrawal Time: 0 hours 18 minutes 9 seconds  Total Procedure Duration: 0 hours 22 minutes 9 seconds  Findings:      The perianal and digital rectal examinations were normal.      Two sessile polyps were found in the ascending colon. The polyps were 4       to 5 mm in size. These polyps were removed with a cold snare. Resection       and retrieval were complete.      Non-bleeding internal hemorrhoids were found during retroflexion. Impression:               - Two 4 to 5 mm polyps in the ascending colon,                            removed with a cold snare. Resected and retrieved.                           - Non-bleeding internal hemorrhoids. Moderate Sedation:      Per Anesthesia Care Recommendation:           - Discharge patient to home (ambulatory).                           - Resume previous diet.                           - Await pathology results.                           - Repeat colonoscopy for surveillance based on                            pathology results. Procedure Code(s):        --- Professional ---  45385, Colonoscopy, flexible; with removal of                            tumor(s), polyp(s), or other lesion(s) by snare                            technique Diagnosis Code(s):        --- Professional ---                           Z12.11, Encounter for screening for malignant                            neoplasm of colon                           D12.2, Benign neoplasm of ascending colon                           K64.8, Other hemorrhoids CPT copyright 2022 American [employer] Association. All rights reserved. The codes documented in this report are preliminary and upon  coder review may  be revised to meet current compliance requirements. Maylon Peppers, MD Maylon Peppers,  12/08/2022 12:57:43 PM This report has been signed electronically. Number of Addenda: 0

## 2022-12-08 NOTE — Anesthesia Postprocedure Evaluation (Signed)
Anesthesia Post Note  Patient: SULEYMAN BOYTE  Procedure(s) Performed: COLONOSCOPY WITH PROPOFOL POLYPECTOMY  Patient location during evaluation: Phase II Anesthesia Type: General Level of consciousness: awake and alert and oriented Pain management: pain level controlled Vital Signs Assessment: post-procedure vital signs reviewed and stable Respiratory status: spontaneous breathing, nonlabored ventilation and respiratory function stable Cardiovascular status: blood pressure returned to baseline and stable Postop Assessment: no apparent nausea or vomiting Anesthetic complications: no  No notable events documented.   Last Vitals:  Vitals:   12/08/22 1109 12/08/22 1253  BP: (!) 122/91 (!) 93/43  Pulse: 61 72  Resp: 16 (!) 21  Temp: 36.7 C 36.9 C  SpO2: 99% 95%    Last Pain:  Vitals:   12/08/22 1253  TempSrc: Oral  PainSc: 0-No pain                 Shawnya Mayor C Sabirin Baray

## 2022-12-09 LAB — SURGICAL PATHOLOGY

## 2022-12-16 ENCOUNTER — Encounter (HOSPITAL_COMMUNITY): Payer: Self-pay | Admitting: Gastroenterology
